# Patient Record
Sex: Female | Born: 1965
Health system: Southern US, Community
[De-identification: ages and names within clinical notes are randomized; demographics above are authoritative.]

## PROBLEM LIST (undated history)

## (undated) DIAGNOSIS — J45909 Unspecified asthma, uncomplicated: Secondary | ICD-10-CM

## (undated) DIAGNOSIS — E079 Disorder of thyroid, unspecified: Secondary | ICD-10-CM

## (undated) HISTORY — PX: OVARIAN CYST REMOVAL: SHX89

## (undated) HISTORY — PX: CHOLECYSTECTOMY: SHX55

---

## 2016-04-19 ENCOUNTER — Emergency Department (HOSPITAL_BASED_OUTPATIENT_CLINIC_OR_DEPARTMENT_OTHER)
Admission: EM | Admit: 2016-04-19 | Discharge: 2016-04-19 | Disposition: A | Discharge status: Home / Self Care | Payer: Managed Care, Other (non HMO) | Attending: Emergency Medicine | Admitting: Emergency Medicine

## 2016-04-19 ENCOUNTER — Encounter (HOSPITAL_BASED_OUTPATIENT_CLINIC_OR_DEPARTMENT_OTHER): Payer: Self-pay | Admitting: *Deleted

## 2016-04-19 ENCOUNTER — Emergency Department (HOSPITAL_BASED_OUTPATIENT_CLINIC_OR_DEPARTMENT_OTHER): Payer: Managed Care, Other (non HMO)

## 2016-04-19 DIAGNOSIS — E079 Disorder of thyroid, unspecified: Secondary | ICD-10-CM | POA: Diagnosis not present

## 2016-04-19 DIAGNOSIS — Z79899 Other long term (current) drug therapy: Secondary | ICD-10-CM | POA: Diagnosis not present

## 2016-04-19 DIAGNOSIS — J45909 Unspecified asthma, uncomplicated: Secondary | ICD-10-CM | POA: Insufficient documentation

## 2016-04-19 DIAGNOSIS — Z7951 Long term (current) use of inhaled steroids: Secondary | ICD-10-CM | POA: Insufficient documentation

## 2016-04-19 DIAGNOSIS — N23 Unspecified renal colic: Secondary | ICD-10-CM | POA: Diagnosis not present

## 2016-04-19 DIAGNOSIS — R1032 Left lower quadrant pain: Secondary | ICD-10-CM | POA: Diagnosis present

## 2016-04-19 HISTORY — DX: Unspecified asthma, uncomplicated: J45.909

## 2016-04-19 HISTORY — DX: Disorder of thyroid, unspecified: E07.9

## 2016-04-19 LAB — CBC WITH DIFFERENTIAL/PLATELET
BASOS ABS: 0 10*3/uL (ref 0.0–0.1)
Basophils Relative: 0 %
Eosinophils Absolute: 0.1 10*3/uL (ref 0.0–0.7)
Eosinophils Relative: 1 %
HCT: 35.7 % — ABNORMAL LOW (ref 36.0–46.0)
HEMOGLOBIN: 12.5 g/dL (ref 12.0–15.0)
LYMPHS PCT: 35 %
Lymphs Abs: 2.4 10*3/uL (ref 0.7–4.0)
MCH: 30.3 pg (ref 26.0–34.0)
MCHC: 35 g/dL (ref 30.0–36.0)
MCV: 86.7 fL (ref 78.0–100.0)
Monocytes Absolute: 0.5 10*3/uL (ref 0.1–1.0)
Monocytes Relative: 7 %
NEUTROS ABS: 4 10*3/uL (ref 1.7–7.7)
NEUTROS PCT: 57 %
Platelets: 262 10*3/uL (ref 150–400)
RBC: 4.12 MIL/uL (ref 3.87–5.11)
RDW: 12.6 % (ref 11.5–15.5)
WBC: 7 10*3/uL (ref 4.0–10.5)

## 2016-04-19 LAB — COMPREHENSIVE METABOLIC PANEL
ALBUMIN: 4.1 g/dL (ref 3.5–5.0)
ALT: 19 U/L (ref 14–54)
ANION GAP: 7 (ref 5–15)
AST: 30 U/L (ref 15–41)
Alkaline Phosphatase: 124 U/L (ref 38–126)
BILIRUBIN TOTAL: 0.6 mg/dL (ref 0.3–1.2)
BUN: 12 mg/dL (ref 6–20)
CO2: 23 mmol/L (ref 22–32)
Calcium: 8.8 mg/dL — ABNORMAL LOW (ref 8.9–10.3)
Chloride: 106 mmol/L (ref 101–111)
Creatinine, Ser: 0.9 mg/dL (ref 0.44–1.00)
GFR calc Af Amer: >60 mL/min (ref >60)
GFR calc non Af Amer: >60 mL/min (ref >60)
GLUCOSE: 117 mg/dL — AB (ref 65–99)
POTASSIUM: 3.3 mmol/L — AB (ref 3.5–5.1)
Sodium: 136 mmol/L (ref 135–145)
TOTAL PROTEIN: 7.1 g/dL (ref 6.5–8.1)

## 2016-04-19 LAB — URINALYSIS, ROUTINE W REFLEX MICROSCOPIC
Bilirubin Urine: NEGATIVE
GLUCOSE, UA: NEGATIVE mg/dL
Hgb urine dipstick: NEGATIVE
Ketones, ur: NEGATIVE mg/dL
LEUKOCYTES UA: NEGATIVE
Nitrite: NEGATIVE
PH: 6 (ref 5.0–8.0)
Protein, ur: NEGATIVE mg/dL
Specific Gravity, Urine: 1.014 (ref 1.005–1.030)

## 2016-04-19 MED ORDER — NAPROXEN 500 MG PO TABS: 500.0000 mg | ORAL_TABLET | Freq: Two times a day (BID) | ORAL | Status: AC

## 2016-04-19 MED ORDER — ONDANSETRON HCL 4 MG/2ML IJ SOLN
4.0000 mg | Freq: Once | INTRAMUSCULAR | Status: AC
  Administered 2016-04-19: 4 mg via INTRAVENOUS
  Filled 2016-04-19: qty 2

## 2016-04-19 MED ORDER — KETOROLAC TROMETHAMINE 30 MG/ML IJ SOLN
30.0000 mg | Freq: Once | INTRAMUSCULAR | Status: AC
  Administered 2016-04-19: 30 mg via INTRAVENOUS
  Filled 2016-04-19: qty 1

## 2016-04-19 MED ORDER — ONDANSETRON 4 MG PO TBDP: 4.0000 mg | ORAL_TABLET | Freq: Three times a day (TID) | ORAL | Status: AC | PRN

## 2016-04-19 MED ORDER — OXYCODONE-ACETAMINOPHEN 5-325 MG PO TABS: 1.0000 | ORAL_TABLET | Freq: Four times a day (QID) | ORAL | Status: AC | PRN

## 2016-04-19 MED ORDER — HYDROMORPHONE HCL 1 MG/ML IJ SOLN
1.0000 mg | Freq: Once | INTRAMUSCULAR | Status: AC
  Administered 2016-04-19: 1 mg via INTRAVENOUS
  Filled 2016-04-19: qty 1

## 2016-04-19 MED ORDER — MORPHINE SULFATE (PF) 4 MG/ML IV SOLN
4.0000 mg | Freq: Once | INTRAVENOUS | Status: AC
  Administered 2016-04-19: 4 mg via INTRAVENOUS
  Filled 2016-04-19: qty 1

## 2016-04-19 MED ORDER — TAMSULOSIN HCL 0.4 MG PO CAPS: 0.4000 mg | ORAL_CAPSULE | Freq: Every day | ORAL | Status: AC

## 2016-04-19 MED ORDER — OXYCODONE-ACETAMINOPHEN 5-325 MG PO TABS
0.5000 | ORAL_TABLET | Freq: Once | ORAL | Status: AC
  Administered 2016-04-19: 0.5 via ORAL
  Filled 2016-04-19: qty 1

## 2016-04-19 MED ORDER — SODIUM CHLORIDE 0.9 % IV SOLN
INTRAVENOUS | Status: DC
  Administered 2016-04-19: 11:00:00 via INTRAVENOUS

## 2016-04-19 MED ORDER — SODIUM CHLORIDE 0.9 % IV BOLUS (SEPSIS)
1000.0000 mL | Freq: Once | INTRAVENOUS | Status: AC
  Administered 2016-04-19: 1000 mL via INTRAVENOUS

## 2016-04-19 MED FILL — ONDANSETRON ODT 4 MG TABLET: 4 | 3 days supply | Qty: 9 | Fill #0

## 2016-04-19 MED FILL — OXYCODONE/APAP 5-325: 5-325 | 2 days supply | Qty: 15 | Fill #0

## 2016-04-19 MED FILL — TAMSULOSIN HCL 0.4 MG CAP: 0.4 | 7 days supply | Qty: 7 | Fill #0

## 2016-04-19 MED FILL — NAPROXEN 500 MG TABLET: 500 | 10 days supply | Qty: 20 | Fill #0

## 2016-04-19 NOTE — Discharge Instructions (Signed)
Please read and follow all provided instructions.  Your diagnoses today include:  1. Ureteral colic     Tests performed today include:  Urine test that showed no infection  CT scan which showed a 4 millimeter kidney on the left side  Blood test that showed normal kidney function  Vital signs. See below for your results today.   Medications prescribed:   Percocet (oxycodone/acetaminophen) - narcotic pain medication  DO NOT drive or perform any activities that require you to be awake and alert because this medicine can make you drowsy. BE VERY CAREFUL not to take multiple medicines containing Tylenol (also called acetaminophen). Doing so can lead to an overdose which can damage your liver and cause liver failure and possibly death.   Naproxen - anti-inflammatory pain medication  Do not exceed 500mg  naproxen every 12 hours, take with food  You have been prescribed an anti-inflammatory medication or NSAID. Take with food. Take smallest effective dose for the shortest duration needed for your pain. Stop taking if you experience stomach pain or vomiting.    Flomax (tamsulosin) - relaxes smooth muscle to help kidney stones pass  Take any prescribed medications only as directed.  Home care instructions:  Follow any educational materials contained in this packet.  Please double your fluid intake for the next several days. Strain your urine and save any stones that may pass.   BE VERY CAREFUL not to take multiple medicines containing Tylenol (also called acetaminophen). Doing so can lead to an overdose which can damage your liver and cause liver failure and possibly death.   Follow-up instructions: Please follow-up with your urologist in the next 1 week for further evaluation of your symptoms.  If you need to return to the Emergency Department, go to [facility] and not [facility]. The [facility]rolog[facility] are [facility] at [facility] and can better care for you at this  location.  Return instructions:  If you need to return to the Emergency Department, go to [facility] and not [facility]. The [facility]rologists are located at [facility] and can better care for you at this location.   Please return to the Emergency Department if you experience worsening symptoms.  Please return if you develop fever or uncontrolled pain or vomiting.  Please return if you have any other emergent concerns.  Additional Information:  Your vital signs today were: BP 115/83 mmHg   Pulse 77   Temp(Src) 97.9 F (36.6 C) (Oral)   Resp 16   Ht 5\' 2"  (1.575 m)   Wt 62.143 kg   BMI 25.05 kg/m2   SpO2 100% If your blood pressure (BP) was elevated above 135/85 this visit, please have this repeated by your doctor within one month. --------------

## 2016-04-19 NOTE — ED Provider Notes (Signed)
CSN: [id number]     Arrival date & time 04/19/16  0944 History   First MD Initiated Contact with Patient 04/19/16 [phone]     Chief Complaint  Patient presents with  . left side pain      (Consider location/radiation/quality/duration/timing/severity/associated sxs/prior Treatment) HPI Comments: Patient from [place], presents with left flank pain, increased urinary frequency with abnormal odor for the past 3 days. Symptoms improved 2 days ago. Patient had called her physician who called in a prescription for Cipro which she has been taking. Symptoms became much worse this morning with sharp, excruciating left back/flank pain. This was associated with vomiting. No fevers or diarrhea. Patient has had some lower left quadrant abdominal pain as well. No blood in urine. No history of kidney stones or pyelonephritis, diverticulitis. No other treatments prior to arrival. Onset of symptoms acute. Course is constant. Nothing makes symptoms better or worse.  The history is provided by the patient.    Past Medical History  Diagnosis Date  . Asthma   . Thyroid disease    Past Surgical History  Procedure Laterality Date  . Cholecystectomy    . Ovarian cyst removal     History reviewed. No pertinent family history. Social History  Substance Use Topics  . Smoking status: Never Smoker   . Smokeless tobacco: None  . Alcohol Use: No   OB History    No data available     Review of Systems  Constitutional: Negative for fever.  HENT: Negative for rhinorrhea and sore throat.   Eyes: Negative for redness.  Respiratory: Negative for cough.   Cardiovascular: Negative for chest pain.  Gastrointestinal: Positive for nausea, vomiting and abdominal pain. Negative for diarrhea.  Genitourinary: Positive for frequency and flank pain. Negative for dysuria, urgency, hematuria, vaginal bleeding and vaginal discharge.  Musculoskeletal: Negative for myalgias.  Skin: Negative for rash.  Neurological: Negative  for headaches.      Allergies  Benadryl  Home Medications   Prior to Admission medications   Medication Sig Start Date End Date Taking? Authorizing Provider  albuterol (PROVENTIL HFA;VENTOLIN HFA) 108 (90 Base) MCG/ACT inhaler Inhale into the lungs every 6 (six) hours as needed for wheezing or shortness of breath.   Yes Historical Provider, MD  levothyroxine (SYNTHROID, LEVOTHROID) 88 MCG tablet Take 88 mcg by mouth daily before breakfast.   Yes Historical Provider, MD  montelukast (SINGULAIR) 10 MG tablet Take 10 mg by mouth at bedtime.   Yes Historical Provider, MD  vitamin B-12 (CYANOCOBALAMIN) 100 MCG tablet Take 100 mcg by mouth daily.   Yes Historical Provider, MD   BP 125/78 mmHg  Pulse 73  Temp(Src) 97.9 F (36.6 C) (Oral)  Resp 20  Ht  (1.575 m)  Wt 62.143 kg  BMI 25.05 kg/m2  SpO2 100% Physical Exam  Constitutional: She appears well-developed and well-nourished. She appears distressed.  HENT:  Head: Normocephalic and atraumatic.  Mouth/Throat: Oropharynx is clear and moist.  Eyes: Conjunctivae are normal. Right eye exhibits no discharge. Left eye exhibits no discharge.  Neck: Normal range of motion. Neck supple.  Cardiovascular: Normal rate, regular rhythm and normal heart sounds.   Pulmonary/Chest: Effort normal and breath sounds normal.  Abdominal: Soft. There is tenderness (Mild left lower quadrant). There is CVA tenderness (Patient has tenderness to light palpation in the left middle back and flank.).  Neurological: She is alert.  Skin: Skin is warm and dry.  Psychiatric: She has a normal mood and affect.  Nursing note  and vitals reviewed.   ED Course  Procedures (including critical care time) Labs Review Labs Reviewed  COMPREHENSIVE METABOLIC PANEL - Abnormal; Notable for the following:    Potassium 3.3 (*)    Glucose, Bld 117 (*)    Calcium 8.8 (*)    All other components within normal limits  CBC WITH DIFFERENTIAL/PLATELET - Abnormal; Notable  for the following:    HCT 35.7 (*)    All other components within normal limits  URINE CULTURE  URINALYSIS, ROUTINE W REFLEX MICROSCOPIC (NOT AT [facility])    Imaging Review Ct Renal Stone Study  04/19/2016  CLINICAL DATA:  50 year old female with left flank pain for several days with nausea vomiting. Initial encounter. EXAM: CT ABDOMEN AND PELVIS WITHOUT CONTRAST TECHNIQUE: Multidetector CT imaging of the abdomen and pelvis was performed following the standard protocol without IV contrast. COMPARISON:  None. FINDINGS: Negative lung bases.  No pericardial or pleural effusion. Minimal dextro convex scoliosis. No acute osseous abnormality identified. No pelvic free fluid. Negative noncontrast uterus and adnexa. Retained stool in the rectum and distal sigmoid. Redundant sigmoid with minimal diverticulosis. Retained stool in the left colon. Negative transverse colon. Negative right colon and appendix. No dilated small bowel. No abdominal free air or free fluid. Surgically absent gallbladder. Negative non contrast stomach, duodenum, spleen, pancreas and adrenal glands. Negative noncontrast liver aside from a a 12 mm simple appearing cyst in the left lobe. Negative noncontrast right kidney and right ureter. Mild to moderate left hydronephrosis. No left nephrolithiasis. No significant perinephric stranding. Mild left hydroureter which continues into the pelvis to the level of a 4 mm distal obstructing calculus located about 1.5 cm from the left ureterovesical junction. The urinary bladder is completely decompressed. No other pelvic calcifications. IMPRESSION: 1. Acute obstructive uropathy on the left with a 4 mm distal ureteral calculus located about 15 mm proximal to the left UVJ. 2. No other urologic calculus. 3. Otherwise negative noncontrast CT Abdomen and Pelvis. Electronically Signed   By: Odessa Fleming M.D.   On: 04/19/2016 11:14   I have personally reviewed and evaluated these images and lab results as part of my  medical decision-making.  10:32 AM Patient seen and examined. Work-up initiated. Medications ordered.   Vital signs reviewed and are as follows: BP 125/78 mmHg  Pulse 73  Temp(Src) 97.9 F (36.6 C) (Oral)  Resp 20  Ht 5\' 2"  (1.575 m)  Wt 62.143 kg  BMI 25.05 kg/m2  SpO2 100%  1:01 PM PT informed of results. Pain was controlled well with toradol. She is tolerating oral fluids. Will discharge to home at this time medication for pain control and Flomax. Patient is returning to [place] soon and will follow-up with a urologist there.  Patient counseled on kidney stone treatment. Urged patient to strain urine and save any stones. Urged urology follow-up and return to [facility] with any complications. Counseled patient to maintain good fluid intake.   Counseled patient on use of Flomax.   Patient counseled on use of narcotic pain medications. Counseled not to combine these medications with others containing tylenol. Urged not to drink alcohol, drive, or perform any other activities that requires focus while taking these medications. The patient verbalizes understanding and agrees with the plan.   MDM   Final diagnoses:  Ureteral colic    Patient with flank pain consistent with ureteral colic. CT performed today shows a 4 mm stone in the left ureter. She has normal kidney function. No associated infection. Pain  controlled in emergency department. Labs are otherwise unremarkable.  No dangerous or life-threatening conditions suspected or identified by history, physical exam, and by work-up. No indications for hospitalization identified.    Renne CriglerJoshua Twylla Arceneaux, PA-C 04/19/16 1302  Vanetta MuldersScott Zackowski, MD 04/25/16 [phone]

## 2016-04-19 NOTE — ED Notes (Signed)
States Sunday began having urinary frequency and pain at left side and left flank, was provided Rx for Cipro, presents today with worsening pain

## 2016-04-19 NOTE — ED Notes (Signed)
Placed on cont POX monitoring with int NBP 

## 2016-04-19 NOTE — ED Notes (Signed)
IVF placed on infusion pump 

## 2016-04-19 NOTE — ED Notes (Signed)
Patient transported to CT 

## 2016-04-19 NOTE — ED Notes (Signed)
Position of comfort provided along with emotional support and nsg care explained

## 2016-04-19 NOTE — ED Notes (Signed)
Pt up to restroom with steady with two staff members

## 2016-04-19 NOTE — ED Notes (Signed)
Sunday had urinary frequency with strong odor in urine, was dx with UTI, Monday feeling better, began antibiotic last night, last dose this am. Pain became worse, had N/V this am with vomiting x 1

## 2016-04-19 NOTE — ED Notes (Signed)
Pt actively vomiting. Pt found sitting in bedside chair, rocking back and forth c/o severe flank pain.

## 2016-04-19 NOTE — ED Notes (Signed)
Safety measures in place, sr x 2 up

## 2016-04-20 LAB — URINE CULTURE
CULTURE: NO GROWTH
Special Requests: NORMAL

## 2017-05-28 IMAGING — CT CT RENAL STONE PROTOCOL
2 of 3 series · 17 of 46 positions shown, 19 images · non-contrast
Comparison: None.

CLINICAL DATA: 50-year-old female with left flank pain for several
days with nausea vomiting. Initial encounter.

EXAM:
CT ABDOMEN AND PELVIS WITHOUT CONTRAST
TECHNIQUE: Multidetector CT imaging of the abdomen and pelvis was performed
following the standard protocol without IV contrast.

[Series 2: axial st · axial · 0.94mm/px · z∈[+646,+1051]mm · 14 of 93 slices shown, 16 images]
[im 6/93  soft-tissue]
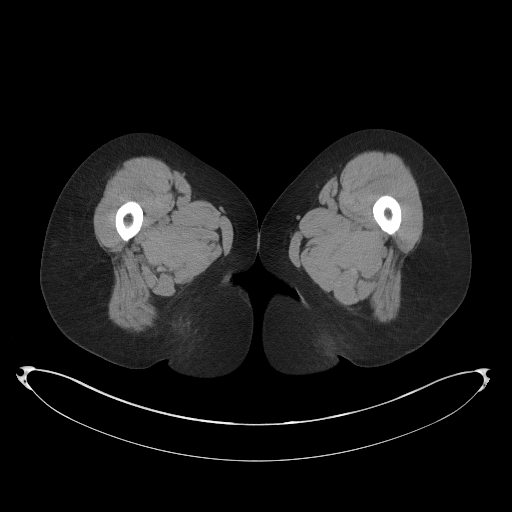
[im 6/93  bone]
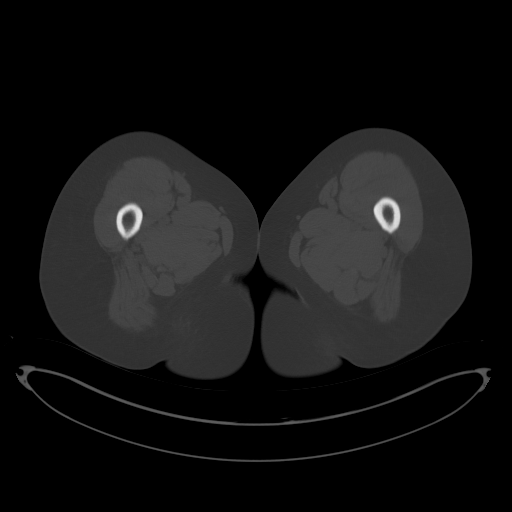
[im 12/93  soft-tissue]
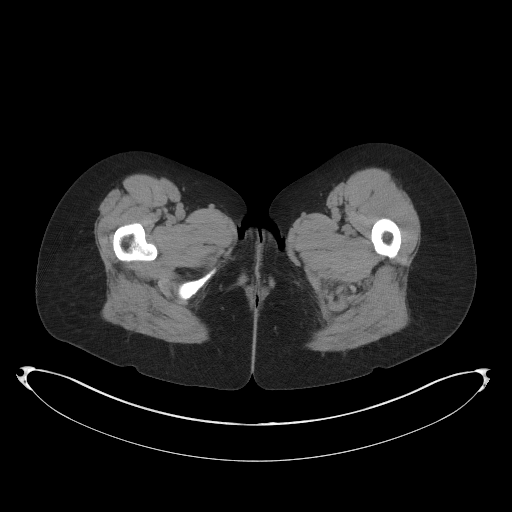
[im 18/93  soft-tissue]
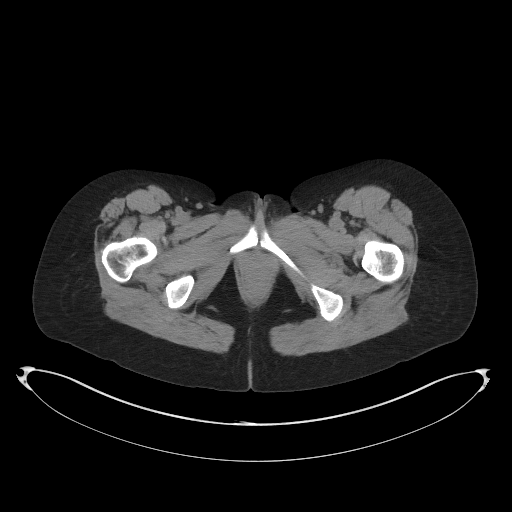
[im 24/93  soft-tissue]
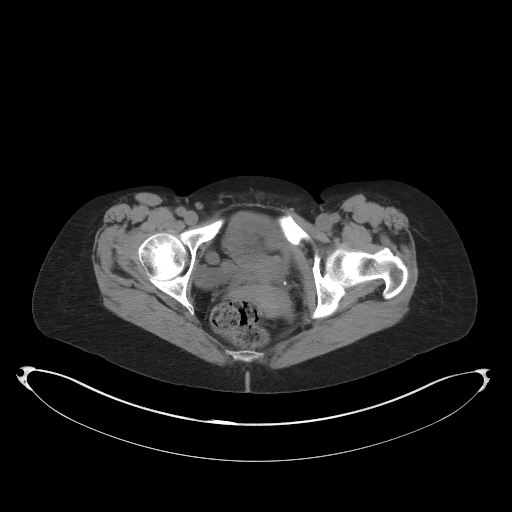
[im 30/93  soft-tissue]
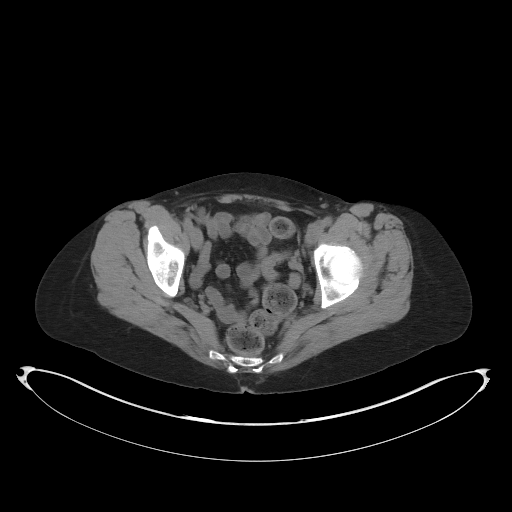
[im 36/93  soft-tissue]
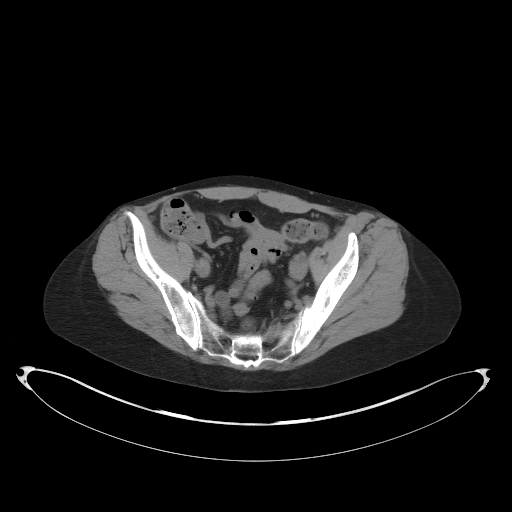
[im 42/93  soft-tissue]
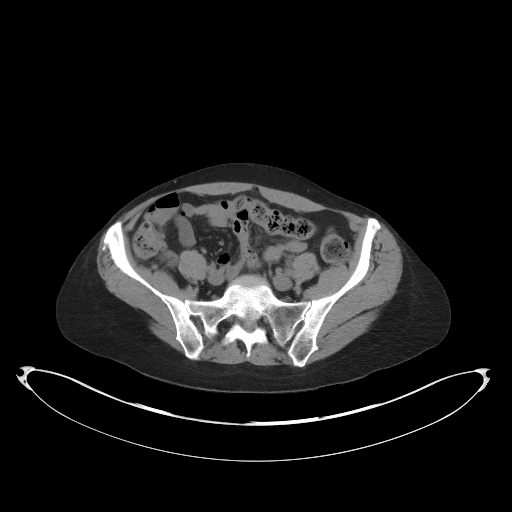
[im 51/93  soft-tissue]
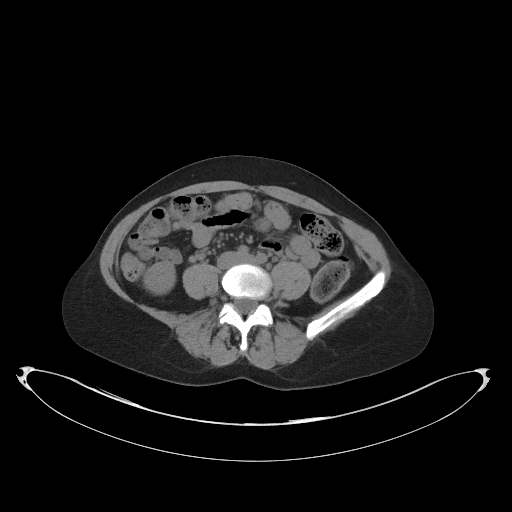
[im 57/93  soft-tissue]
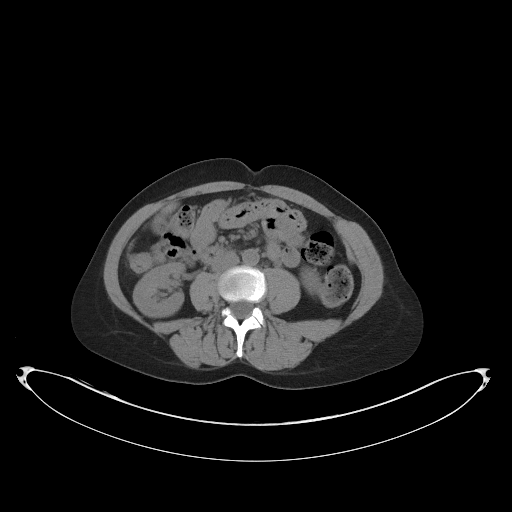
[im 57/93  bone]
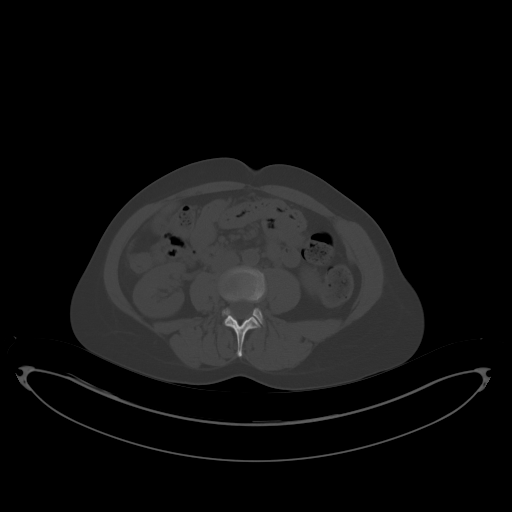
[im 63/93  soft-tissue]
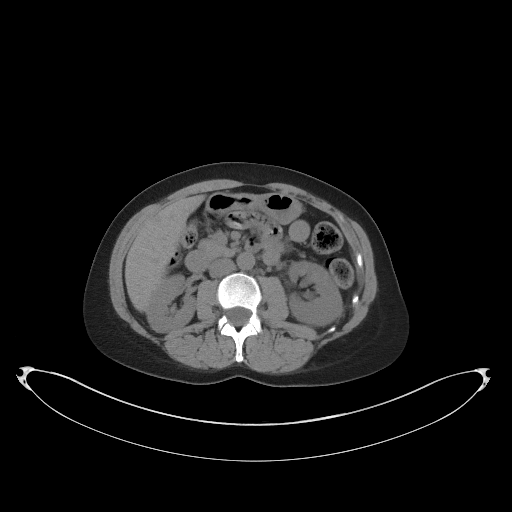
[im 69/93  soft-tissue]
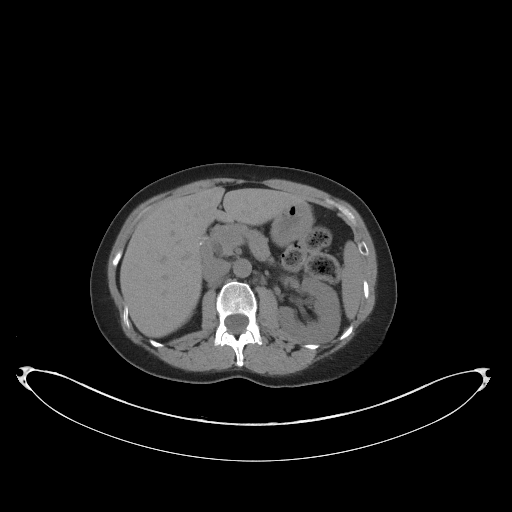
[im 75/93  soft-tissue]
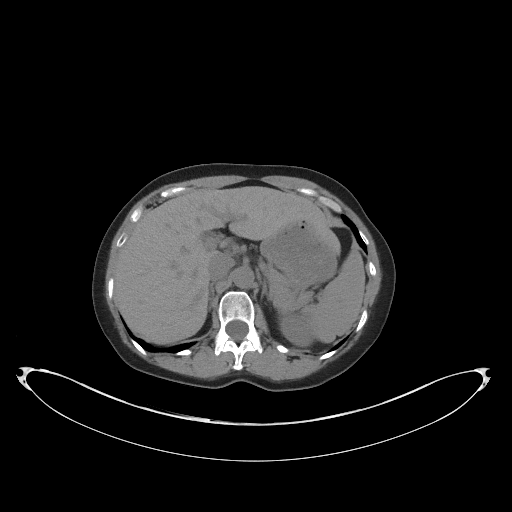
[im 81/93  soft-tissue]
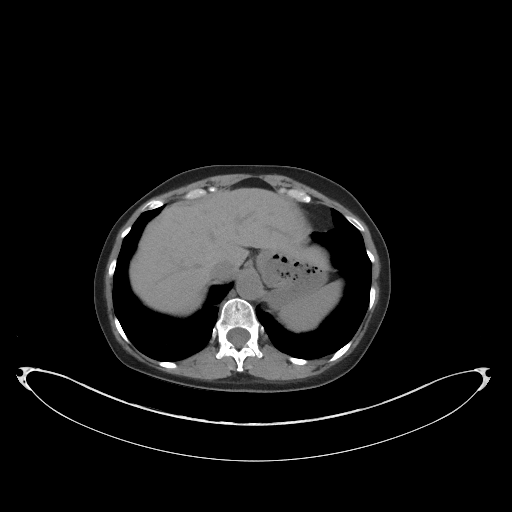
[im 87/93  soft-tissue]
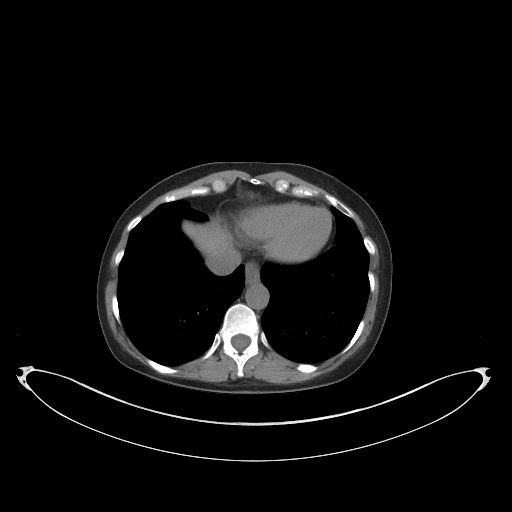

[Series 4: coronal st · coronal · 0.88mm/px · 3 of 81 slices shown]
[im 27/81  soft-tissue]
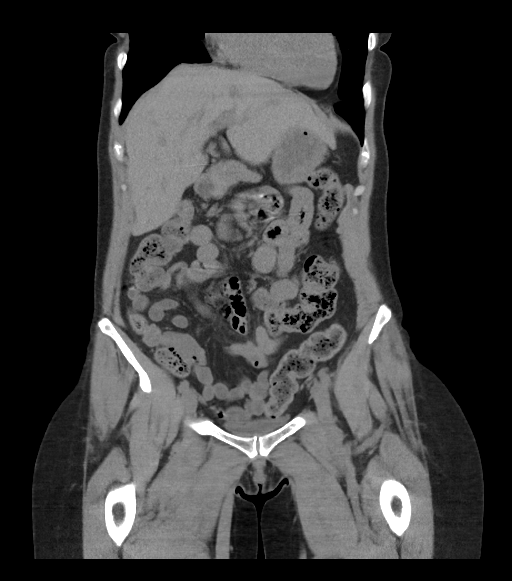
[im 36/81  soft-tissue]
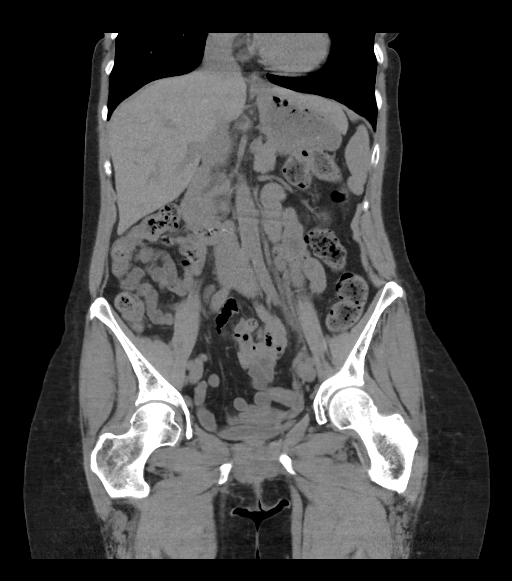
[im 45/81  soft-tissue]
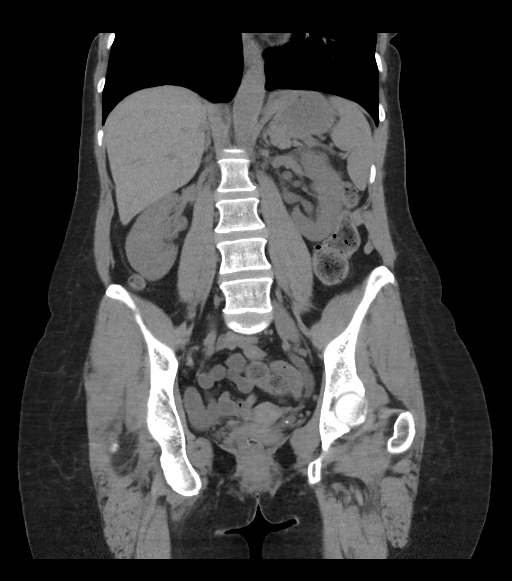

[17 of 46 positions shown; findings below may reference images not displayed]

FINDINGS: Negative lung bases.  No pericardial or pleural effusion.

Minimal dextro convex scoliosis. No acute osseous abnormality
identified.

No pelvic free fluid. Negative noncontrast uterus and adnexa.
Retained stool in the rectum and distal sigmoid. Redundant sigmoid
with minimal diverticulosis.

Retained stool in the left colon. Negative transverse colon.
Negative right colon and appendix. No dilated small bowel. No
abdominal free air or free fluid. Surgically absent gallbladder.
Negative non contrast stomach, duodenum, spleen, pancreas and
adrenal glands. Negative noncontrast liver aside from a a 12 mm
simple appearing cyst in the left lobe.

Negative noncontrast right kidney and right ureter. Mild to moderate
left hydronephrosis. No left nephrolithiasis. No significant
perinephric stranding. Mild left hydroureter which continues into
the pelvis to the level of a 4 mm distal obstructing calculus
located about 1.5 cm from the left ureterovesical junction. The
urinary bladder is completely decompressed. No other pelvic
calcifications.
IMPRESSION: 1. Acute obstructive uropathy on the left with a 4 mm distal
ureteral calculus located about 15 mm proximal to the left UVJ.
2. No other urologic calculus.
3. Otherwise negative noncontrast CT Abdomen and Pelvis.
